# Patient Record
Sex: Male | Born: 1960 | Hispanic: Yes | Marital: Single | Smoking: Never smoker
Health system: Southern US, Community
[De-identification: ages and names within clinical notes are randomized; demographics above are authoritative.]

---

## 1999-12-04 ENCOUNTER — Emergency Department (HOSPITAL_COMMUNITY): Admission: EM | Admit: 1999-12-04 | Discharge: 1999-12-04 | Payer: Self-pay | Admitting: *Deleted

## 1999-12-23 ENCOUNTER — Encounter: Payer: Self-pay | Admitting: Emergency Medicine

## 1999-12-23 ENCOUNTER — Emergency Department (HOSPITAL_COMMUNITY): Admission: EM | Admit: 1999-12-23 | Discharge: 1999-12-23 | Payer: Self-pay | Admitting: Emergency Medicine

## 2000-01-01 ENCOUNTER — Emergency Department (HOSPITAL_COMMUNITY): Admission: EM | Admit: 2000-01-01 | Discharge: 2000-01-01 | Payer: Self-pay | Admitting: Emergency Medicine

## 2000-03-17 ENCOUNTER — Emergency Department (HOSPITAL_COMMUNITY): Admission: EM | Admit: 2000-03-17 | Discharge: 2000-03-17 | Payer: Self-pay | Admitting: Emergency Medicine

## 2000-03-17 ENCOUNTER — Encounter: Payer: Self-pay | Admitting: Emergency Medicine

## 2004-08-13 ENCOUNTER — Ambulatory Visit: Payer: Self-pay | Admitting: Internal Medicine

## 2004-08-13 ENCOUNTER — Inpatient Hospital Stay (HOSPITAL_COMMUNITY): Admission: EM | Admit: 2004-08-13 | Discharge: 2004-08-15 | Payer: Self-pay

## 2004-08-22 ENCOUNTER — Ambulatory Visit: Payer: Self-pay | Admitting: Internal Medicine

## 2012-05-12 ENCOUNTER — Ambulatory Visit: Payer: BC Managed Care – PPO

## 2012-05-12 ENCOUNTER — Ambulatory Visit (INDEPENDENT_AMBULATORY_CARE_PROVIDER_SITE_OTHER): Payer: BC Managed Care – PPO | Admitting: Family Medicine

## 2012-05-12 VITALS — BP 128/84 | HR 63 | Temp 97.9°F | Resp 14 | Ht 65.5 in | Wt 159.4 lb

## 2012-05-12 DIAGNOSIS — M549 Dorsalgia, unspecified: Secondary | ICD-10-CM

## 2012-05-12 DIAGNOSIS — S32009A Unspecified fracture of unspecified lumbar vertebra, initial encounter for closed fracture: Secondary | ICD-10-CM

## 2012-05-12 DIAGNOSIS — S32010A Wedge compression fracture of first lumbar vertebra, initial encounter for closed fracture: Secondary | ICD-10-CM

## 2012-05-12 MED ORDER — HYDROCODONE-ACETAMINOPHEN 5-500 MG PO TABS: 1.0000 | ORAL_TABLET | Freq: Three times a day (TID) | ORAL | Status: DC | PRN

## 2012-05-12 NOTE — Progress Notes (Signed)
Is a 51 year old gentleman who comes in with his wife and son. He's complaining about lower back pain following a fall on Saturday. 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 He fell out of a tree on Saturday landing on his butt. He has no weakness or numbness in his legs but is moving very gingerly and carefully and cannot bend easily.  Objective: Mild discomfort when moving but no acute distress No bony tenderness in the back and no ecchymosis. There is no overlying skin rash.  Straight-leg raising: Negative Reflexes: Normal AJ and KJ Muscle strength and gait are stable.  [facility] reading (PRIMARY) by  Dr. Milus Glazier:  L1 compression fracture  Assessment:  L1 compression fracture without neurological compromise. Patient is in moderate pain and his job involves quite a bit of lifting.  Plan: Take patient out of work for week, provide Vicodin 5-500 #30 every 6 hours when necessary and recheck in 1 week for allowing patient to return to work. 1. Back pain  DG Lumbar Spine 2-3 Views, HYDROcodone-acetaminophen (VICODIN) 5-500 MG per tablet

## 2012-05-19 ENCOUNTER — Ambulatory Visit (INDEPENDENT_AMBULATORY_CARE_PROVIDER_SITE_OTHER): Payer: BC Managed Care – PPO | Admitting: Family Medicine

## 2012-05-19 VITALS — BP 104/67 | HR 57 | Temp 98.5°F | Resp 16 | Ht 64.5 in | Wt 162.0 lb

## 2012-05-19 DIAGNOSIS — M549 Dorsalgia, unspecified: Secondary | ICD-10-CM

## 2012-05-19 DIAGNOSIS — M545 Low back pain, unspecified: Secondary | ICD-10-CM

## 2012-05-19 MED ORDER — HYDROCODONE-ACETAMINOPHEN 5-500 MG PO TABS: 1.0000 | ORAL_TABLET | Freq: Three times a day (TID) | ORAL | Status: DC | PRN

## 2012-05-19 NOTE — Progress Notes (Signed)
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  He has been out of work for 5 days and the pain level is down to 4/10.  His job involves heavy lifting all day.  O:  Less distress.  Patient now has full back ROM with no tenderness.  Muscle strength in legs appears to be good No loss of feeling in legs  A:  Improving, but cannot return to work this week to allow more complete healing  P:  Return Sunday with hopes of returning to work, at least restricted duty 1. Back pain  HYDROcodone-acetaminophen (VICODIN) 5-500 MG per tablet

## 2012-05-25 ENCOUNTER — Ambulatory Visit (INDEPENDENT_AMBULATORY_CARE_PROVIDER_SITE_OTHER): Payer: BC Managed Care – PPO | Admitting: Emergency Medicine

## 2012-05-25 VITALS — BP 102/72 | HR 64 | Temp 98.4°F | Resp 16 | Ht 66.0 in | Wt 167.0 lb

## 2012-05-25 DIAGNOSIS — M549 Dorsalgia, unspecified: Secondary | ICD-10-CM

## 2012-05-25 DIAGNOSIS — IMO0002 Reserved for concepts with insufficient information to code with codable children: Secondary | ICD-10-CM

## 2012-05-25 MED ORDER — HYDROCODONE-ACETAMINOPHEN 5-500 MG PO TABS: 1.0000 | ORAL_TABLET | Freq: Three times a day (TID) | ORAL | Status: AC | PRN
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
## 2012-06-01 ENCOUNTER — Other Ambulatory Visit: Payer: Self-pay | Admitting: Family Medicine

## 2013-03-06 ENCOUNTER — Encounter (HOSPITAL_COMMUNITY): Payer: Self-pay | Admitting: *Deleted

## 2013-03-06 ENCOUNTER — Emergency Department (HOSPITAL_COMMUNITY)
Admission: EM | Admit: 2013-03-06 | Discharge: 2013-03-06 | Disposition: A | Discharge status: Home / Self Care | Payer: Worker's Compensation | Attending: Emergency Medicine | Admitting: Emergency Medicine

## 2013-03-06 ENCOUNTER — Emergency Department (HOSPITAL_COMMUNITY): Payer: Worker's Compensation

## 2013-03-06 DIAGNOSIS — S298XXA Other specified injuries of thorax, initial encounter: Secondary | ICD-10-CM | POA: Insufficient documentation

## 2013-03-06 DIAGNOSIS — Y990 Civilian activity done for income or pay: Secondary | ICD-10-CM | POA: Insufficient documentation

## 2013-03-06 DIAGNOSIS — IMO0002 Reserved for concepts with insufficient information to code with codable children: Secondary | ICD-10-CM | POA: Insufficient documentation

## 2013-03-06 DIAGNOSIS — R42 Dizziness and giddiness: Secondary | ICD-10-CM | POA: Insufficient documentation

## 2013-03-06 DIAGNOSIS — S2341XA Sprain of ribs, initial encounter: Secondary | ICD-10-CM

## 2013-03-06 DIAGNOSIS — S199XXA Unspecified injury of neck, initial encounter: Secondary | ICD-10-CM | POA: Insufficient documentation

## 2013-03-06 DIAGNOSIS — R55 Syncope and collapse: Secondary | ICD-10-CM | POA: Insufficient documentation

## 2013-03-06 DIAGNOSIS — S0993XA Unspecified injury of face, initial encounter: Secondary | ICD-10-CM | POA: Insufficient documentation

## 2013-03-06 DIAGNOSIS — W2209XA Striking against other stationary object, initial encounter: Secondary | ICD-10-CM | POA: Insufficient documentation

## 2013-03-06 DIAGNOSIS — Y9289 Other specified places as the place of occurrence of the external cause: Secondary | ICD-10-CM | POA: Insufficient documentation

## 2013-03-06 DIAGNOSIS — S0990XA Unspecified injury of head, initial encounter: Secondary | ICD-10-CM | POA: Insufficient documentation

## 2013-03-06 MED ORDER — HYDROCODONE-ACETAMINOPHEN 5-325 MG PO TABS
2.0000 | ORAL_TABLET | Freq: Once | ORAL | Status: AC
  Administered 2013-03-06: 2 via ORAL
  Filled 2013-03-06: qty 2

## 2013-03-06 MED ORDER — HYDROCODONE-ACETAMINOPHEN 5-325 MG PO TABS: 2.0000 | ORAL_TABLET | ORAL | Status: DC | PRN

## 2013-03-06 MED ORDER — ONDANSETRON 4 MG PO TBDP: 4.0000 mg | ORAL_TABLET | Freq: Three times a day (TID) | ORAL | Status: DC | PRN

## 2013-03-06 NOTE — ED Notes (Signed)
Workers comp urine drug screen completed

## 2013-03-06 NOTE — ED Notes (Signed)
Pt and supervisor requesting workers comp urine drug screen

## 2013-03-06 NOTE — Progress Notes (Signed)
[facility] has seen patient and provided him with a list of primary care resources, as well as, a [facility] application. Patient family stated that he already had a PCP. I still left them with the information just in case they needed another source.
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
## 2013-03-17 NOTE — ED Provider Notes (Signed)
Medical screening examination/treatment/procedure(s) were performed by non-physician practitioner and as supervising physician I was immediately available for consultation/collaboration.    Vida Roller, MD 03/17/13 1340

## 2014-03-08 ENCOUNTER — Ambulatory Visit (INDEPENDENT_AMBULATORY_CARE_PROVIDER_SITE_OTHER): Payer: BC Managed Care – PPO | Admitting: Family Medicine

## 2014-03-08 VITALS — BP 130/70 | HR 69 | Temp 97.4°F | Resp 16 | Ht 66.0 in | Wt 162.0 lb

## 2014-03-08 DIAGNOSIS — L237 Allergic contact dermatitis due to plants, except food: Secondary | ICD-10-CM

## 2014-03-08 DIAGNOSIS — R21 Rash and other nonspecific skin eruption: Secondary | ICD-10-CM

## 2014-03-08 LAB — POCT CBC
Granulocyte percent: 72.9 %G (ref 37–80)
HCT, POC: 45.7 % (ref 43.5–53.7)
Hemoglobin: 14.7 g/dL (ref 14.1–18.1)
Lymph, poc: 1.8 (ref 0.6–3.4)
MCH, POC: 31.5 pg — AB (ref 27–31.2)
MCHC: 32.2 g/dL (ref 31.8–35.4)
MCV: 98.1 fL — AB (ref 80–97)
MID (cbc): 0.7 (ref 0–0.9)
MPV: 8.3 fL (ref 0–99.8)
POC Granulocyte: 6.6 (ref 2–6.9)
POC LYMPH PERCENT: 19.5 %L (ref 10–50)
POC MID %: 7.6 %M (ref 0–12)
Platelet Count, POC: 244 10*3/uL (ref 142–424)
RBC: 4.66 M/uL — AB (ref 4.69–6.13)
RDW, POC: 13.3 %
WBC: 9 10*3/uL (ref 4.6–10.2)

## 2014-03-08 LAB — POCT SEDIMENTATION RATE: POCT SED RATE: 6 mm/hr (ref 0–22)

## 2014-03-08 MED ORDER — METHYLPREDNISOLONE ACETATE 80 MG/ML IJ SUSP
120.0000 mg | Freq: Once | INTRAMUSCULAR | Status: AC
  Administered 2014-03-08: 120 mg via INTRAMUSCULAR

## 2014-03-08 MED ORDER — PREDNISONE 10 MG PO TABS: ORAL_TABLET | ORAL | Status: DC

## 2014-03-08 MED ORDER — CEPHALEXIN 500 MG PO CAPS: 500.0000 mg | ORAL_CAPSULE | Freq: Four times a day (QID) | ORAL | Status: DC

## 2014-03-08 NOTE — Patient Instructions (Signed)
Hiedra venenosa  (Poison Ivy) Luego de la exposicin previa a la planta. La erupcin suele aparecer 48 horas despus de la exposicin. Suelen ser bultos (ppulas) o ampollas (vesculas) en un patrn lineal. abrirse. Los ojos tambin podran hincharse. Las hinchazn es peor por la maana y mejora a medida que avanza el da. Deben tomarse todas las precauciones para prevenir una infeccin bacteriana (por grmenes) secundaria, que puede ocasionar cicatrices. Mantenga todas las reas abiertas secas, limpias y vendadas y cbralas con un ungento antibacteriano, en caso que lo necesite. Si no aparece una infeccin secundaria, esta dermatitis generalmente se cura dentro de las 2 o 3 semanas sin tratamiento. INSTRUCCIONES PARA EL CUIDADO DOMICILIARIO Lvese cuidadosamente con agua y jabn tan pronto como ocurra la exposicin al txico. Tiene alrededor de media hora para retirar la resina de la planta antes de que le cause el sarpullido. El lavado destruir rpidamente el aceite o antgeno que se encuentra sobre la piel y que podr causar el sarpullido. Lave enrgicamente debajo de las uas. Todo resto de resina seguir diseminando el sarpullido. No se frote la piel vigorosamente cuando lava la zona afectada. La dermatitis no se extender si retira todo el aceite de la planta que haya quedado en su cuerpo. Un sarpullido que se ha transformado en lesiones que supuran (llagas) no diseminar el sarpullido, a menos que no se haya lavado cuidadosamente. Tambin es importante lavar todas las prendas que haya utilizado. Pueden tener alrgenos activos. El sarpullido volver, an varios das ms tarde. La mejor medida es evitar el contacto con la planta en el futuro. La hiedra venenosa puede reconocerse por el nmero de hojas, En general, la hiedra venenosa tiene tres hojas con ramas floridas en un tallo simple. Podr adquirir difenhidramina que es un medicamento de venta libre, y utilizarlo segn lo necesite para aliviar la  picazn. No conduzca automviles si este medicamento le produce somnolencia. Consulte con el profesional que lo asiste acerca de los medicamentos que podr administrarle a los nios. SOLICITE ATENCIN MDICA SI:  Observa reas abiertas.  Enrojecimiento que se extiende ms all de la zona del sarpullido.  Una secrecin purulenta (similar al pus).  Aumento del dolor.  Desarrolla otros signos de infeccin (como fiebre). Document Released: 07/04/2005 Document Revised: 12/17/2011 [employer] Patient Information 2014 [employer].  

## 2014-03-08 NOTE — Progress Notes (Signed)
Subjective:    Patient ID: Christopher Lam, male    DOB: 05-29-1961, 53 y.o.   MRN: [id number] This chart was scribed for Delman Cheadle, MD by Randa Evens, ED Scribe. This Patient was seen in room 01 and the patients care was started at 6:55 PM  Chief Complaint  Patient presents with  . Poison Ivy    rash on bilateral arms, legs and face x 4 days    HPI HPI Comments: Christopher Lam is a 53 y.o. male who presents to the [facility] complaining of poison ivy rash onset Thursday night. States that the rash is located on all of his extremities. He states that he was cutting weeds when he was exposed to the poison ivy. States that he has been trying otc insect bite and hydrocortisone lotion with no improvement to his symptoms.He denies cough, SOB, chest pain or fever.  No past medical history on file. Current Outpatient Prescriptions on File Prior to Visit  Medication Sig Dispense Refill  . HYDROcodone-acetaminophen (NORCO/VICODIN) 5-325 MG per tablet Take 2 tablets by mouth every 4 (four) hours as needed for pain.  6 tablet  0  . Multiple Vitamin (MULTIVITAMIN WITH MINERALS) TABS Take 1 tablet by mouth daily.      . ondansetron (ZOFRAN ODT) 4 MG disintegrating tablet Take 1 tablet (4 mg total) by mouth every 8 (eight) hours as needed for nausea.  10 tablet  0   No current facility-administered medications on file prior to visit.   No Known Allergies  Review of Systems  Constitutional: Negative for fever.  Respiratory: Negative for cough and shortness of breath.   Cardiovascular: Negative for chest pain.  Skin: Positive for rash.   BP 130/70  Pulse 69  Temp(Src) 97.4 F (36.3 C)  Resp 16  Ht 5\' 6"  (1.676 m)  Wt 162 lb (73.483 kg)  BMI 26.16 kg/m2  SpO2 98%  Objective:    Physical Exam  Nursing note and vitals reviewed. Constitutional: He is oriented to person, place, and time. He appears well-developed and well-nourished. No distress.  HENT:  Head:  Normocephalic and atraumatic.  Eyes: EOM are normal.  Neck: Neck supple.  Cardiovascular: Normal rate.   Pulmonary/Chest: Effort normal. No respiratory distress.  Musculoskeletal: Normal range of motion.  Neurological: He is alert and oriented to person, place, and time.  Skin: Skin is warm and dry. Rash noted. There is erythema.  Bilateral forearms with tense diffuse erythema, tiny vesicle almost spread con fluently extending into smaller papules up bilateral arms, forearms with blanching erythema warmth and not pitting edema  Erythema around left upper and lower eye lid, left forehead , cheeks,   Left leg with diffuse erythema edema, and vesicles  spilling honey colored fluid. No lesions on right leg  Psychiatric: He has a normal mood and affect. His behavior is normal.   Results for orders placed in visit on 03/08/14  POCT CBC      Result Value Ref Range   WBC 9.0  4.6 - 10.2 K/uL   Lymph, poc 1.8  0.6 - 3.4   POC LYMPH PERCENT 19.5  10 - 50 %L   MID (cbc) 0.7  0 - 0.9   POC MID % 7.6  0 - 12 %M   POC Granulocyte 6.6  2 - 6.9   Granulocyte percent 72.9  37 - 80 %G   RBC 4.66 (*) 4.69 - 6.13 M/uL   Hemoglobin 14.7  14.1 -  18.1 g/dL   HCT, POC 45.7  43.5 - 53.7 %   MCV 98.1 (*) 80 - 97 fL   MCH, POC 31.5 (*) 27 - 31.2 pg   MCHC 32.2  31.8 - 35.4 g/dL   RDW, POC 13.3     Platelet Count, POC 244  142 - 424 K/uL   MPV 8.3  0 - 99.8 fL    Assessment & Plan:   Rash and nonspecific skin eruption - Plan: POCT CBC, POCT SEDIMENTATION RATE, methylPREDNISolone acetate (DEPO-MEDROL) injection 120 mg  Poison ivy dermatitis  Meds ordered this encounter  Medications  . methylPREDNISolone acetate (DEPO-MEDROL) injection 120 mg    Sig:   . cephALEXin (KEFLEX) 500 MG capsule    Sig: Take 1 capsule (500 mg total) by mouth 4 (four) times daily.    Dispense:  28 capsule    Refill:  0    Instructions in spanish please  . predniSONE (DELTASONE) 10 MG tablet    Sig: 6-5-4-3-2-1 tabs po  qd x 6d taper.    Dispense:  21 tablet    Refill:  0    Instructions in spanish please    I personally performed the services described in this documentation, which was scribed in my presence. The recorded information has been reviewed and considered, and addended by me as needed.  Delman Cheadle, MD MPH

## 2015-04-02 IMAGING — CR DG RIBS W/ CHEST 3+V*R*
3 series · 3 of 3 positions shown · non-contrast
Comparison: 08/14/2004

CLINICAL DATA: Trauma, rib pain

RIGHT RIBS AND CHEST - 3+ VIEW

[w chest pa (1 of 3)]
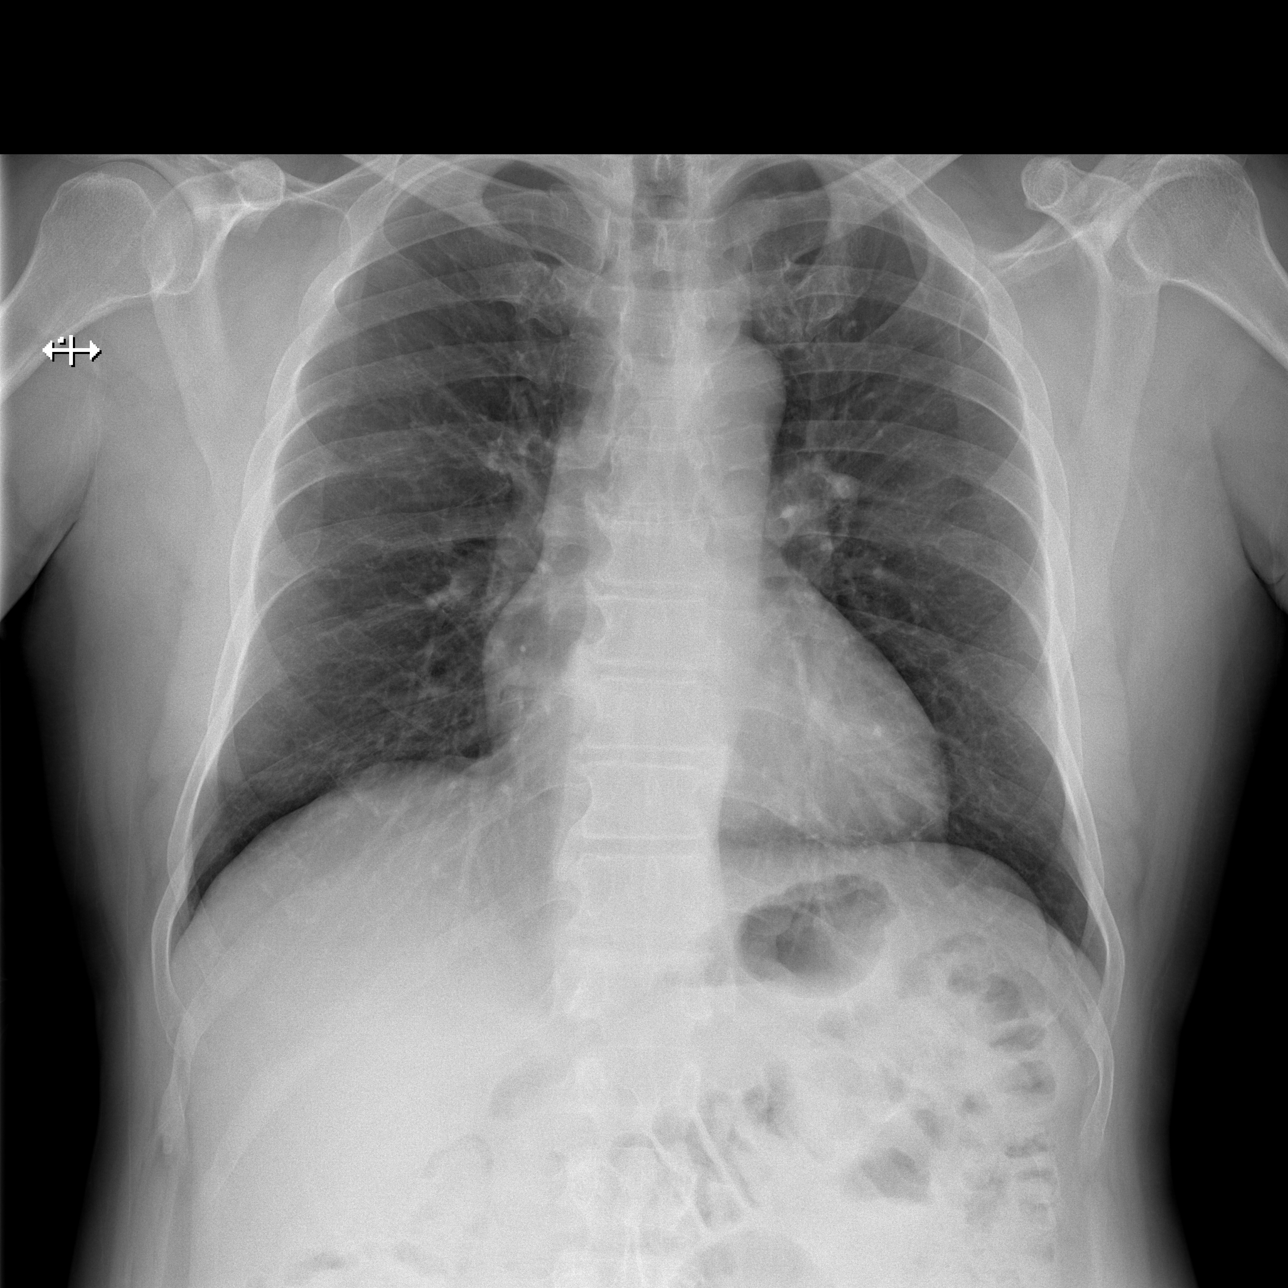

[w chest pa (2 of 3)]
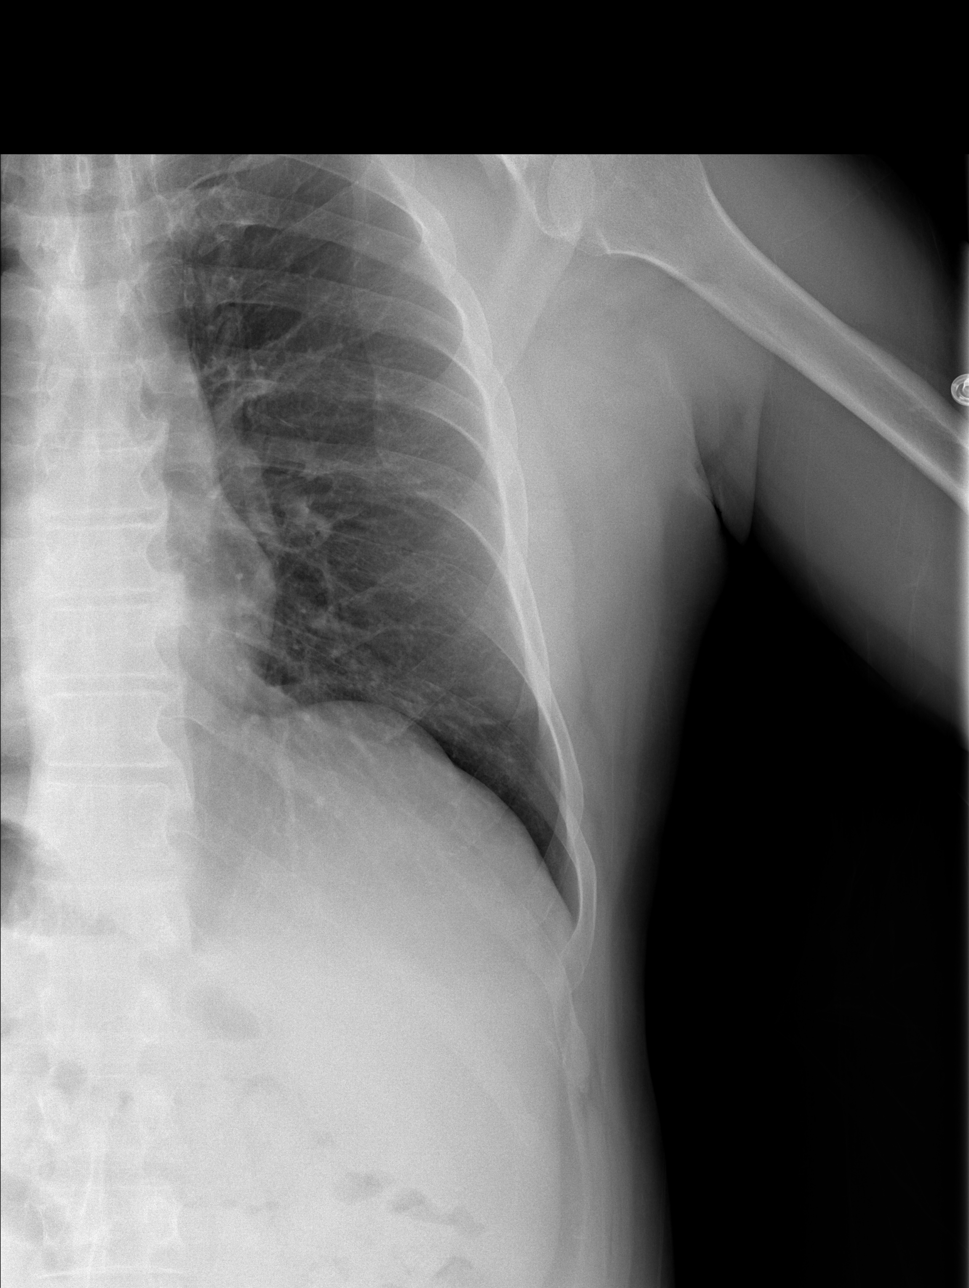

[w chest pa (3 of 3)]
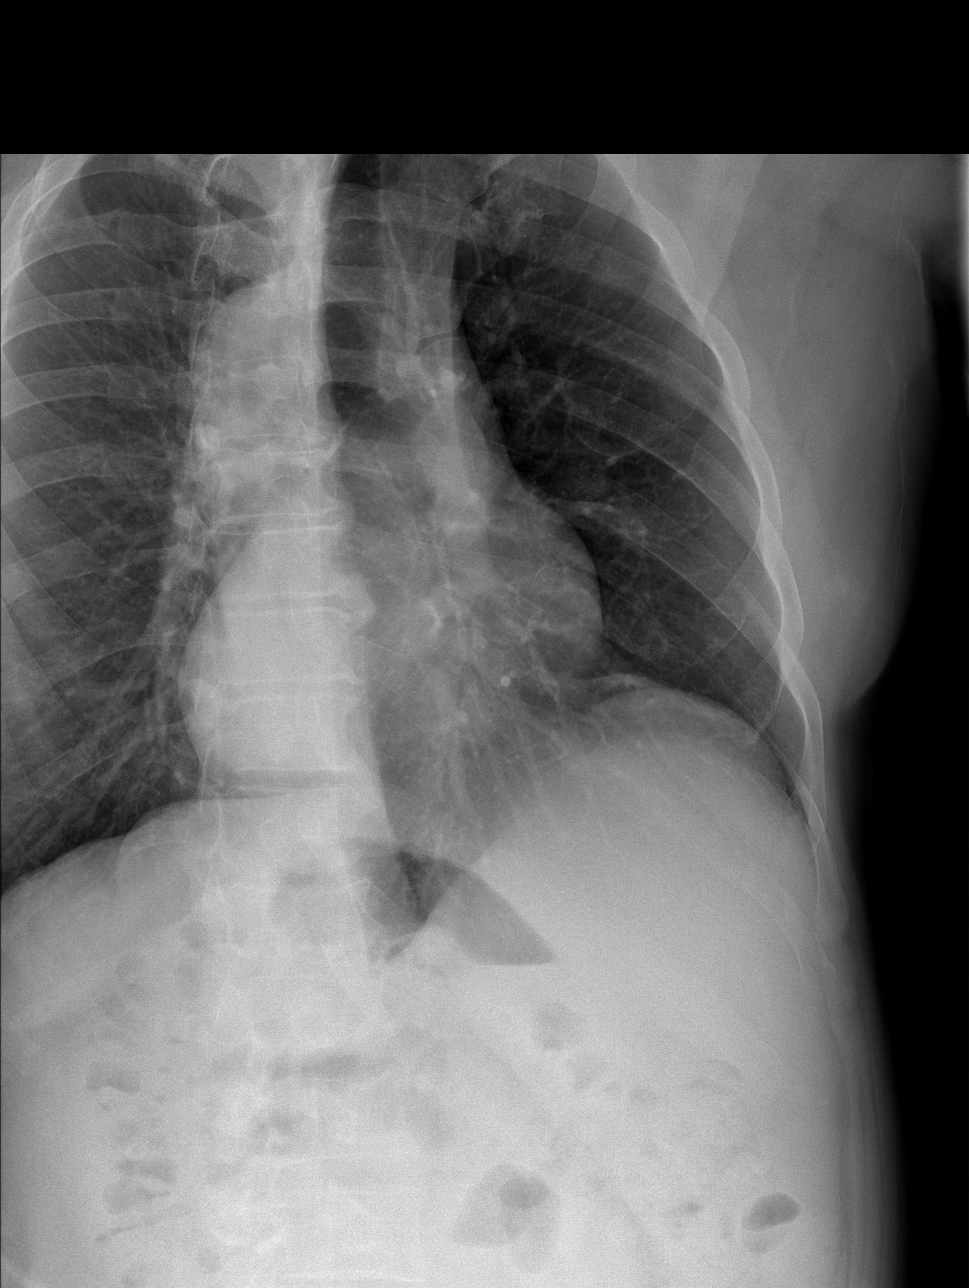

[3 of 3 positions shown; findings below may reference images not displayed]

FINDINGS: Normal heart size and vascularity.  Clear lungs.  No
collapse, consolidation, airspace process, effusion, or
pneumothorax.

Right rib views demonstrate no displaced fracture or focal rib
abnormality.  No subcutaneous emphysema.  No soft tissue
abnormality.
IMPRESSION: No acute finding

## 2015-04-02 IMAGING — CT CT HEAD W/O CM
3 of 6 series · 16 of 47 positions shown, 19 images · non-contrast
Comparison: 08/13/2004
COMPARISON: 08/13/2004

CLINICAL DATA: Head injury, headache, dizziness, neck pain

CT HEAD WITHOUT CONTRAST
TECHNIQUE: Contiguous axial images were obtained from the base of
the skull through the vertex without contrast
TECHNIQUE: Multidetector CT imaging of the cervical spine was
performed without intravenous contrast.  Multiplanar CT image
reconstructions were also generated.

[Series 602: <mpr thick range> · axial · 0.34mm/px · z∈[-283,-125]mm · 10 of 105 slices shown, 13 images]
[im 10/105  brain]
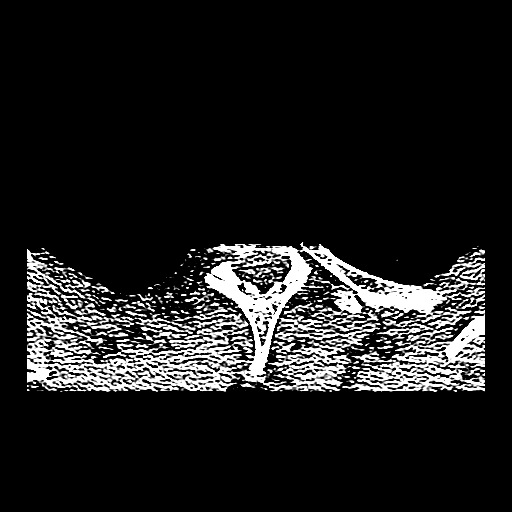
[im 10/105  bone]
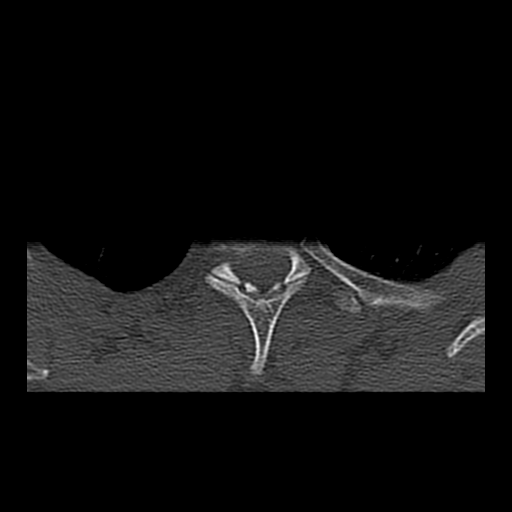
[im 19/105  brain]
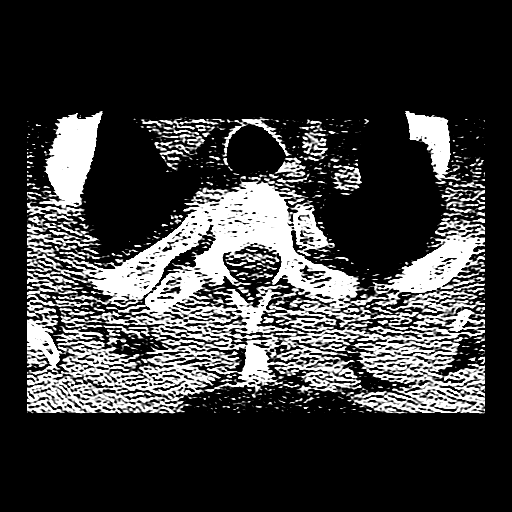
[im 29/105  brain]
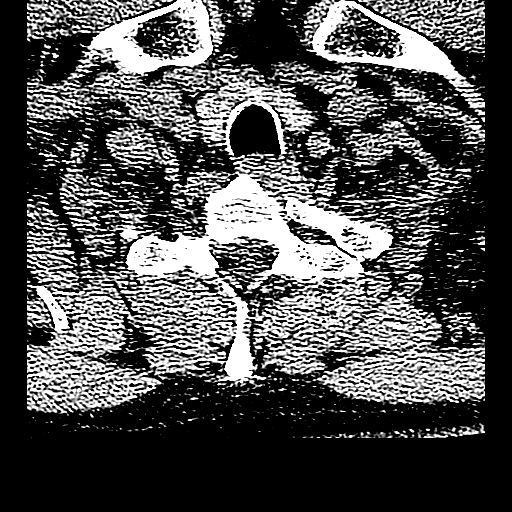
[im 38/105  brain]
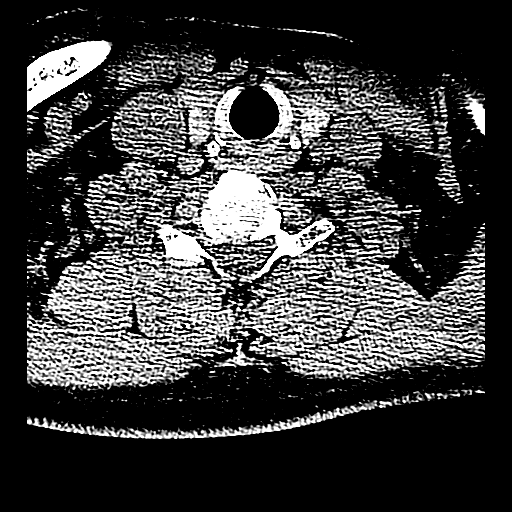
[im 48/105  brain]
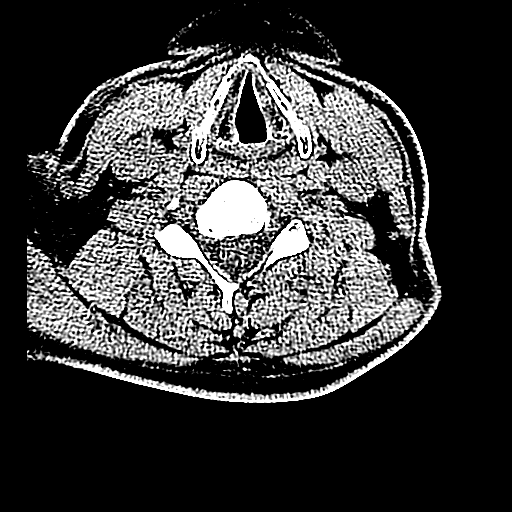
[im 48/105  bone]
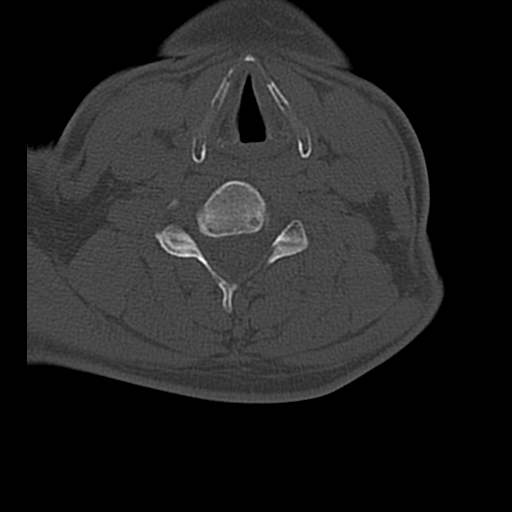
[im 57/105  brain]
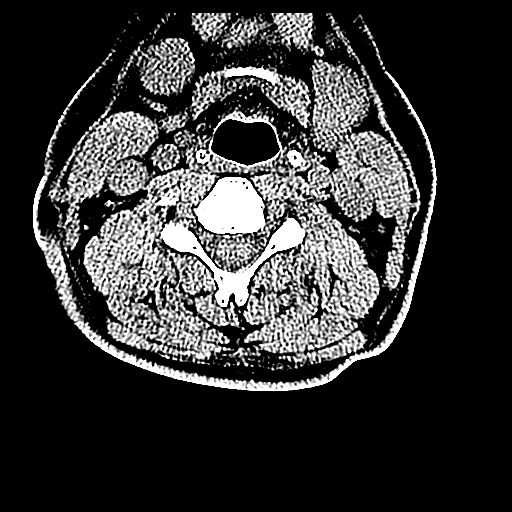
[im 67/105  brain]
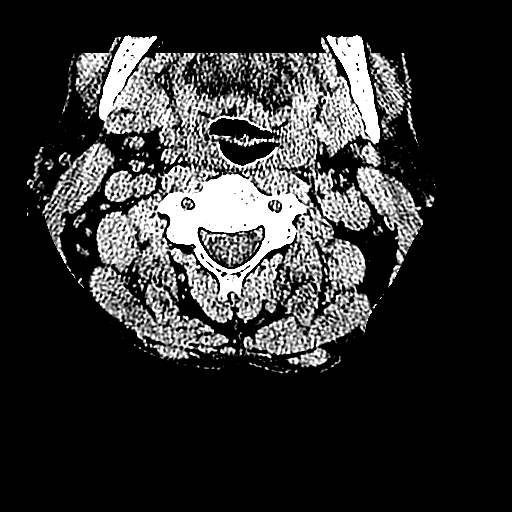
[im 76/105  brain]
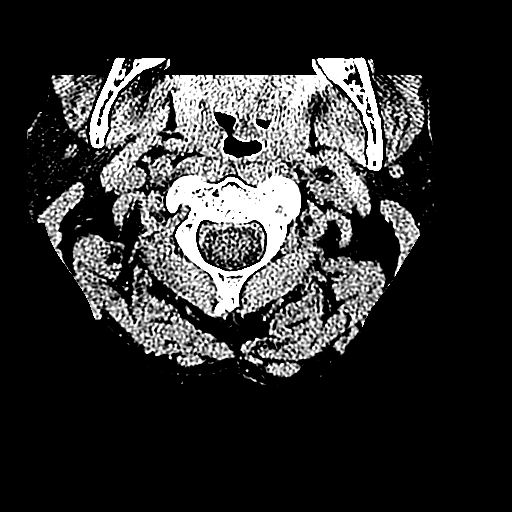
[im 86/105  brain]
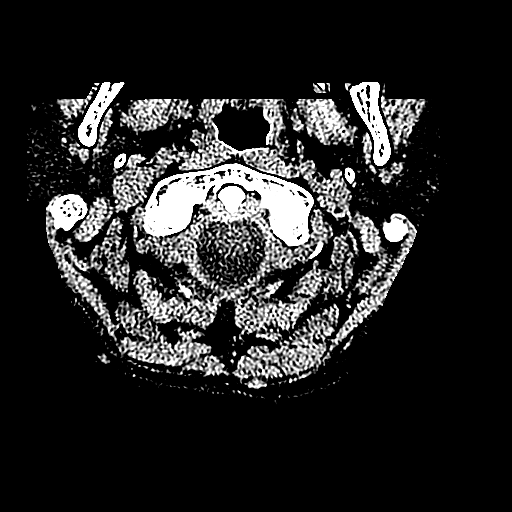
[im 86/105  bone]
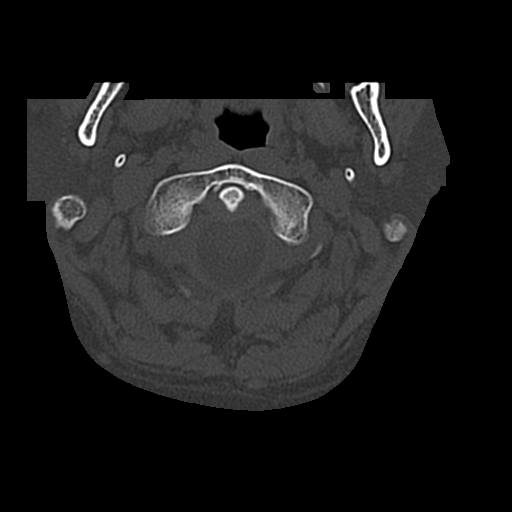
[im 95/105  brain]
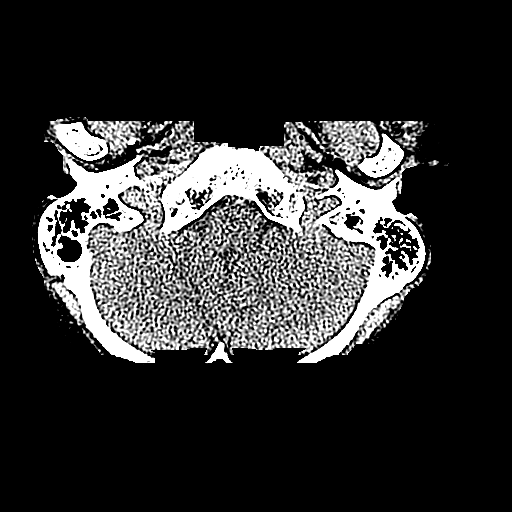

[Series 603: <mpr thick range(1)> · coronal · 0.34mm/px · 3 of 54 slices shown]
[im 18/54  brain]
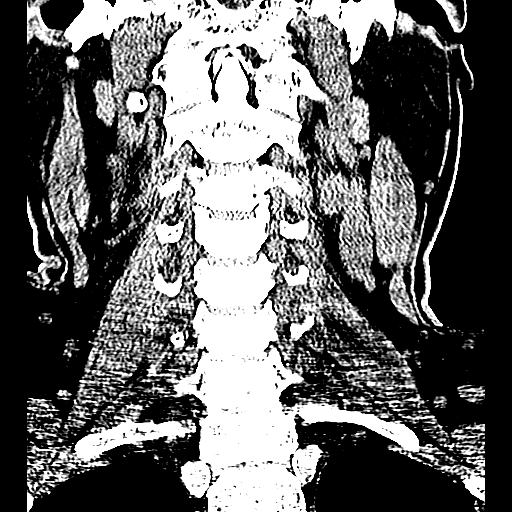
[im 24/54  brain]
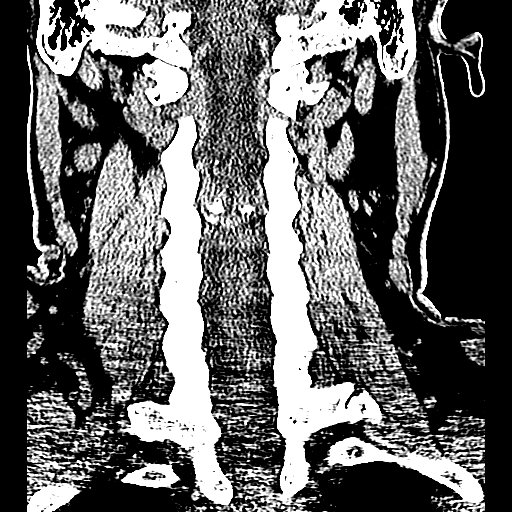
[im 30/54  brain]
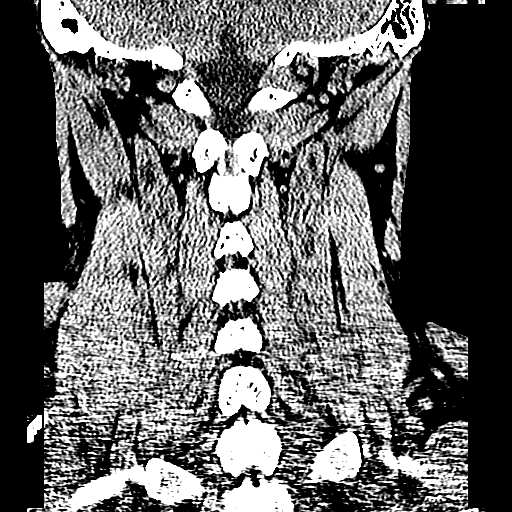

[Series 604: <mpr thick range(2)> · sagittal · 0.34mm/px · 3 of 49 slices shown]
[im 17/49  brain]
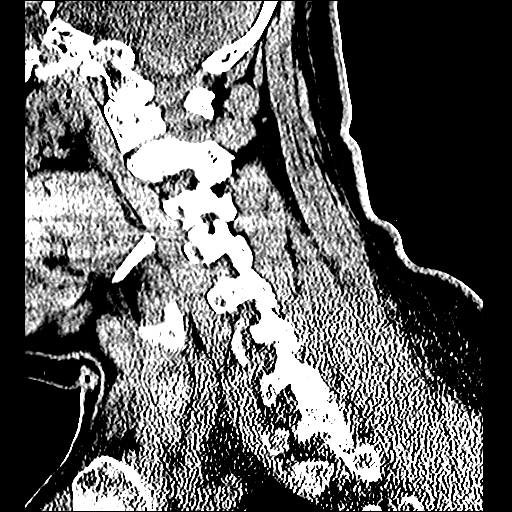
[im 25/49  brain]
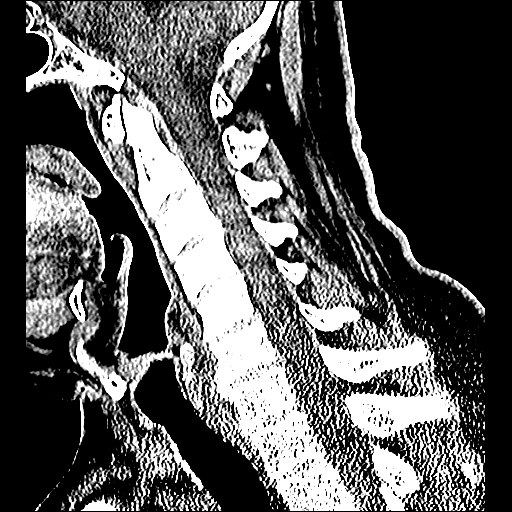
[im 33/49  brain]
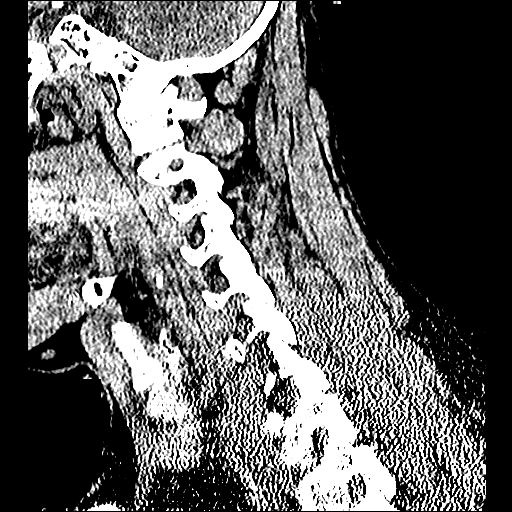

[16 of 47 positions shown; findings below may reference images not displayed]

FINDINGS: The brain has a normal appearance without evidence for
hemorrhage, acute infarction, hydrocephalus, or mass lesion.  There
is no extra axial fluid collection.  The skull and paranasal
sinuses are normal.
IMPRESSION: Normal CT of the head without contrast.

CT CERVICAL SPINE WITHOUT CONTRAST
FINDINGS: Straightened cervical spine alignment may be positional.
No fracture, compression deformity or focal kyphosis.  Facets
aligned.  Similar minor degenerative change at the C6-7 anteriorly.
Preserved vertebral body heights.  Intact odontoid.  Normal
prevertebral soft tissues.  Lung apices clear.
IMPRESSION: Stable minor degenerative changes.  No acute fracture or osseous
abnormality by CT.

## 2016-08-07 DIAGNOSIS — Z23 Encounter for immunization: Secondary | ICD-10-CM | POA: Diagnosis not present

## 2016-11-26 ENCOUNTER — Ambulatory Visit (INDEPENDENT_AMBULATORY_CARE_PROVIDER_SITE_OTHER): Payer: BLUE CROSS/BLUE SHIELD | Admitting: Urgent Care

## 2016-11-26 VITALS — BP 122/68 | HR 60 | Temp 98.1°F | Resp 16 | Ht 66.0 in | Wt 170.4 lb

## 2016-11-26 DIAGNOSIS — Z1211 Encounter for screening for malignant neoplasm of colon: Secondary | ICD-10-CM | POA: Diagnosis not present

## 2016-11-26 DIAGNOSIS — Z0000 Encounter for general adult medical examination without abnormal findings: Secondary | ICD-10-CM

## 2016-11-26 DIAGNOSIS — F1010 Alcohol abuse, uncomplicated: Secondary | ICD-10-CM | POA: Diagnosis not present

## 2016-11-26 DIAGNOSIS — Z23 Encounter for immunization: Secondary | ICD-10-CM | POA: Diagnosis not present

## 2016-11-26 DIAGNOSIS — H538 Other visual disturbances: Secondary | ICD-10-CM | POA: Diagnosis not present

## 2016-11-26 NOTE — Patient Instructions (Addendum)
Keeping you healthy  Get these tests  Blood pressure- Have your blood pressure checked once a year by your healthcare provider.  Normal blood pressure is 120/80  Weight- Have your body mass index (BMI) calculated to screen for obesity.  BMI is a measure of body fat based on height and weight. You can also calculate your own BMI at ViewBanking.si.  Cholesterol- Have your cholesterol checked every year.  Diabetes- Have your blood sugar checked regularly if you have high blood pressure, high cholesterol, have a family history of diabetes or if you are overweight.  Screening for Colon Cancer- Colonoscopy starting at age 42.  Screening may begin sooner depending on your family history and other health conditions. Follow up colonoscopy as directed by your Gastroenterologist.  Screening for Prostate Cancer- Both blood work (PSA) and a rectal exam help screen for Prostate Cancer.  Screening begins at age 2 with African-American men and at age 68 with Caucasian men.  Screening may begin sooner depending on your family history.  Take these medicines  Aspirin- One aspirin daily can help prevent Heart disease and Stroke.  Flu shot- Every fall.  Tetanus- Every 10 years.  Zostavax- Once after the age of 66 to prevent Shingles.  Pneumonia shot- Once after the age of 57; if you are younger than 14, ask your healthcare provider if you need a Pneumonia shot.  Take these steps  Don't smoke- If you do smoke, talk to your doctor about quitting.  For tips on how to quit, go to www.smokefree.gov or call 1-800-QUIT-NOW.  Be physically active- Exercise 5 days a week for at least 30 minutes.  If you are not already physically active start slow and gradually work up to 30 minutes of moderate physical activity.  Examples of moderate activity include walking briskly, mowing the yard, dancing, swimming, bicycling, etc.  Eat a healthy diet- Eat a variety of healthy food such as fruits, vegetables, low  fat milk, low fat cheese, yogurt, lean meant, poultry, fish, beans, tofu, etc. For more information go to www.thenutritionsource.org  Drink alcohol in moderation- Limit alcohol intake to less than two drinks a day. Never drink and drive.  Dentist- Brush and floss twice daily; visit your dentist twice a year.  Depression- Your emotional health is as important as your physical health. If you're feeling down, or losing interest in things you would normally enjoy please talk to your healthcare provider.  Eye exam- Visit your eye doctor every year.  Safe sex- If you may be exposed to a sexually transmitted infection, use a condom.  Seat belts- Seat belts can save your life; always wear one.  Smoke/Carbon Monoxide detectors- These detectors need to be installed on the appropriate level of your home.  Replace batteries at least once a year.  Skin cancer- When out in the sun, cover up and use sunscreen 15 SPF or higher.  Violence- If anyone is threatening you, please tell your healthcare provider.  Living Will/ Health care power of attorney- Speak with your healthcare provider and family.    Vacuna Td (contra la difteria y el ttanos): Lo que debe saber (Td Vaccine Margaretmary Eddy and Diphtheria]: What You Need to Know) 1. Por qu vacunarse? El ttanos y la difteria son enfermedades muy graves. Son Futures trader frecuentes en los [place] actualmente, pero las personas que se infectan suelen tener complicaciones graves. La vacuna Td se [place] para proteger a los adolescentes y a los adultos de ambas enfermedades. Tanto el ttanos como la difteria  son infecciones causadas por bacterias. La difteria se transmite de persona a persona a travs de la tos o el estornudo. La bacteria que causa el ttanos entra al cuerpo a travs de cortes, raspones o heridas. El TTANOS (trismo) provoca entumecimiento y Writer dolorosa de los msculos, por lo general, en todo el cuerpo.  Puede causar el endurecimiento de  los msculos de la cabeza y el cuello, de modo que impide abrir la boca, tragar y en algunos casos, Ambulance person. El ttanos es causa de muerte en aproximadamente 1de cada 10personas que contraen la infeccin, incluso despus de que reciben la mejor atencin mdica. La DIFTERIA puede hacer que se forme una membrana gruesa en la parte posterior de la garganta.  Puede causar problemas respiratorios, parlisis, insuficiencia cardaca e incluso la muerte. Antes de las vacunas, en los [place] se informaban 200000 casos de difteria y cientos de casos de ttanos cada ao. Desde que comenz la vacunacin, los informes de casos de ambas enfermedades se han reducido en un 99%. 2. Edward Jolly Td La vacuna Td protege a adolescentes y adultos contra el ttanos y la difteria. La vacuna Td habitualmente se aplica como dosis de refuerzo cada 10aos, pero tambin puede administrarse antes si la persona sufre una George o herida sucia y grave. A veces, en lugar de la vacuna Td, se recomienda una vacuna llamada Tdap, que protege contra la tosferina, adems de proteger contra el ttanos y la difteria. El mdico o la persona que le aplique la vacuna puede darle ms informacin al [employer]. La Td puede administrarse de manera segura simultneamente con otras vacunas. 3. Algunas personas no deben recibir la vacuna  Una persona que alguna vez ha tenido una reaccin alrgica potencialmente mortal a una dosis anterior de cualquier vacuna contra el ttanos o la difteria, O que tenga una alergia grave a cualquier parte de esta vacuna, no debe recibir la vacuna Td. Informe a la persona que le aplica la vacuna si usted tiene cualquier alergia grave.  Consulte con su mdico si:  tuvo hinchazn o dolor intenso despus de recibir cualquier vacuna contra la difteria o el ttanos,  alguna vez ha sufrido el sndrome de [place],  no se siente Pharmacologist en que se ha programado la vacuna. 4. [place] de [place] reaccin a  la vacuna Con cualquier medicamento, incluyendo las vacunas, existe la posibilidad de que aparezcan efectos secundarios. Suelen ser leves y desaparecen por s solos. Tambin son posibles las reacciones graves, pero en raras ocasiones. [place] personas a las que se les aplica la vacuna Td no tienen ningn problema. Problemas leves despus de la vacuna Td: (No interfirieron en otras actividades)  Dolor en el lugar donde se aplic la vacuna (alrededor de 8de cada 10personas)  Enrojecimiento o hinchazn en el lugar donde se aplic la vacuna (alrededor de 1de cada 4personas)  Fiebre leve (poco frecuente)  Dolor de Pensions consultant (alrededor de 1de cada 4personas)  Cansancio (alrededor de 1de cada 4personas) Problemas moderados despus de la vacuna Td: (Interfirieron en otras actividades, pero no requirieron atencin mdica)  Fiebre superior a 102F (38,8C) (poco frecuente) Problemas graves despus de la vacuna Td: (Impidieron Optometrist las actividades habituales; requirieron atencin mdica)  Clinical cytogeneticist, dolor intenso, sangrado o enrojecimiento en el brazo en que se aplic la vacuna (poco frecuente). Problemas que podran ocurrir despus de cualquier vacuna:   Las personas a veces se desmayan despus de un procedimiento mdico, incluida la vacunacin. Si permanece sentado o recostado durante  15 minutos puede ayudar a [employer] y las lesiones causadas por las cadas. Informe al mdico si se siente mareado, tiene cambios en la visin o zumbidos en los odos.  Algunas personas sienten un dolor intenso en el hombro y tienen dificultad para mover el brazo donde se coloc la vacuna. Esto sucede con muy poca frecuencia.  Cualquier medicamento puede causar una reaccin alrgica grave. Dichas reacciones son Orlene Erm poco frecuentes con una vacuna (se calcula que menos de 1en un milln de dosis) y se producen de unos minutos a unas horas despus de Writer. Al igual que con  cualquier [employer], existe una probabilidad muy remota de que una vacuna cause una lesin grave o la [place]. Se controla permanentemente la seguridad de las vacunas. Para obtener ms informacin, visite: http://www.aguilar.org/. 5. Qu pasa si hay una reaccin grave? A qu signos debo estar atento?   Observe todo lo que le preocupe, como signos de una reaccin alrgica grave, fiebre muy alta o comportamiento fuera de lo normal. Los signos de una reaccin alrgica grave pueden incluir ronchas, hinchazn de la cara y la garganta, dificultad para respirar, latidos cardacos acelerados, mareos y debilidad. Generalmente, estos comenzaran entre unos pocos minutos y algunas horas despus de la vacunacin. Qu debo hacer?   Si usted piensa que se trata de una reaccin alrgica grave o de otra emergencia que no puede esperar, llame al 911 o dirjase al hospital ms cercano. Sino, llame a su mdico.  Despus, la reaccin debe informarse al Sistema de Informacin sobre Efectos Adversos de las [place] (Vaccine Adverse Event Reporting System, VAERS). Su mdico puede presentar este informe, o puede hacerlo usted mismo a travs del sitio web de VAERS, en www.vaers.SamedayNews.es, o llamando al 5638731752. VAERS no brinda recomendaciones mdicas.  6. [place] Compensacin de Daos por [place] de Compensacin de Daos por Clinical biochemist (National Vaccine Injury Compensation Program, VICP) es un programa federal que fue creado para Patent examiner a las personas que puedan haber sufrido daos al recibir ciertas vacunas. Aquellas personas que consideren que han sufrido un dao como consecuencia de una vacuna y [place] saber ms acerca del programa y de cmo presentar Raechel Chute, pueden llamar al [phone] o visitar su sitio web en GoldCloset.com.ee. Hay un lmite de tiempo para presentar un reclamo de compensacin. 7. Cmo puedo obtener ms informacin?  Consulte a su  mdico. Este puede darle el prospecto de la vacuna o recomendarle otras fuentes de informacin.  Comunquese con el servicio de salud de su localidad o su estado.  Comunquese con los Centros para Building surveyor y la Prevencin de Probation officer for Disease Control and Prevention , CDC).  Llame al [phone] (1-800-CDC-INFO).  Visite el sitio Biomedical engineer en http://hunter.com/. Declaracin de informacin sobre la vacuna contra la difteria y el ttanos (Td) de los CDC (01/17/16) Esta informacin no tiene Marine scientist el consejo del mdico. Asegrese de hacerle al mdico cualquier pregunta que tenga. Document Released: 01/10/2009 Document Revised: 10/15/2014 Document Reviewed: 01/17/2016 Elsevier Interactive Patient Education  2017 [employer].     IF you received an x-ray today, you will receive an invoice from [facility] Rad[facility]. Please contact [facility] Radiology at [phone] with questions or concerns regarding your invoice.   IF you received labwork today, you will receive an invoice from [facility]. Please contact [facility] at [phone] with questions or concerns regarding your invoice.   Our billing staff will not be able to assist you with questions  regarding bills from these companies.  You will be contacted with the lab results as soon as they are available. The fastest way to get your results is to activate your My Chart account. Instructions are located on the last page of this paperwork. If you have not heard from [place] regarding the results in 2 weeks, please contact this office.

## 2016-11-26 NOTE — Progress Notes (Signed)
MRN: [id number]  Subjective:   [name] is a 56 y.o. male presenting for annual physical exam. Christopher Lam has a significant other, has 3 children. Has good relationships at home, has a good support network. He works as a Scientist, physiological. Binge drinks on the weekends, has 24 beers on Saturday-Sunday. Denies smoking cigarettes.   Medical care team includes: PCP: No PCP Per Patient Vision: Denies having had an eye exam. Plans on setting this up on his own. Dental: Gets dental cleanings every 6 months. Specialists: None.   Christopher Lam has a current medication list which includes the following prescription(s): multivitamin with minerals. He has No Known Allergies. Christopher Lam denies past medical history and past surgical history. Denies family history of cancer, diabetes, HTN, HL, heart disease, stroke, mental illness.   Immunizations: Flu shot on 07/08/2016. Needs tdap today.  Review of Systems  Constitutional: Negative for chills, diaphoresis, fever, malaise/fatigue and weight loss.  HENT: Negative for congestion, ear discharge, ear pain, hearing loss, nosebleeds, sore throat and tinnitus.   Eyes: Negative for blurred vision, double vision, photophobia, pain, discharge and redness.  Respiratory: Negative for cough, shortness of breath and wheezing.   Cardiovascular: Negative for chest pain, palpitations and leg swelling.  Gastrointestinal: Negative for abdominal pain, blood in stool, constipation, diarrhea, nausea and vomiting.  Genitourinary: Negative for dysuria, flank pain, frequency, hematuria and urgency.  Musculoskeletal: Negative for back pain, joint pain and myalgias.  Skin: Negative for itching and rash.  Neurological: Negative for dizziness, tingling, seizures, loss of consciousness, weakness and headaches.  Endo/Heme/Allergies: Negative for polydipsia.  Psychiatric/Behavioral: Negative for depression, hallucinations, memory loss, substance abuse and suicidal ideas. The patient is not  nervous/anxious and does not have insomnia.    Objective:   Vitals: BP 122/68 (BP Location: Right Arm, Patient Position: Sitting, Cuff Size: Small)   Pulse 60   Temp 98.1 F (36.7 C) (Oral)   Resp 16   Ht 5\' 6"  (1.676 m)   Wt 170 lb 6.4 oz (77.3 kg)   SpO2 97%   BMI 27.50 kg/m    Visual Acuity Screening   Right eye Left eye Both eyes  Without correction: 20/70 20/40 20/40   With correction:      Physical Exam  Constitutional: He is oriented to person, place, and time. He appears well-developed and well-nourished.  HENT:  TM's intact bilaterally, no effusions or erythema. Nasal turbinates pink and moist, nasal passages patent. No sinus tenderness. Oropharynx clear, mucous membranes moist, dentition in good repair.  Eyes: Conjunctivae and EOM are normal. Pupils are equal, round, and reactive to light. Right eye exhibits no discharge. Left eye exhibits no discharge. No scleral icterus.  Neck: Normal range of motion. Neck supple. No thyromegaly present.  Cardiovascular: Normal rate, regular rhythm and intact distal pulses.  Exam reveals no gallop and no friction rub.   No murmur heard. Pulmonary/Chest: No stridor. No respiratory distress. He has no wheezes. He has no rales.  Abdominal: Soft. Bowel sounds are normal. He exhibits no distension and no mass. There is no tenderness.  Musculoskeletal: Normal range of motion. He exhibits no edema or tenderness.  Lymphadenopathy:    He has no cervical adenopathy.  Neurological: He is alert and oriented to person, place, and time. He has normal reflexes.  Skin: Skin is warm and dry. No rash noted. No erythema. No pallor.  Psychiatric: He has a normal mood and affect.   Assessment and Plan :   1. Annual physical exam - Labs  pending, patient is medically stable. - Discussed healthy lifestyle, diet, exercise, preventative care, vaccinations, and addressed patient's concerns.   2. Screen for colon cancer - Ambulatory referral to  Gastroenterology  3. Blurred vision - Patient will schedule his appointment per his request.  4. Need for Tdap vaccination - Tdap vaccine greater than or equal to 7yo IM  5. Alcohol consumption binge drinking - Discussed alcohol abuse on the weekends. Recommended patient drink 1-2 beers daily instead of the 24 pack over 2 days.  - Counseled on risks of heavy alcohol use/binge drinking. He agreed to cut back.  Jaynee Eagles, PA-C [facility] [phone] 11/26/2016  3:45 PM

## 2016-11-27 LAB — COMPREHENSIVE METABOLIC PANEL
ALT: 35 IU/L (ref 0–44)
AST: 19 IU/L (ref 0–40)
Albumin/Globulin Ratio: 1.7 (ref 1.2–2.2)
Albumin: 4.3 g/dL (ref 3.5–5.5)
Alkaline Phosphatase: 114 IU/L (ref 39–117)
BUN/Creatinine Ratio: 15 (ref 9–20)
BUN: 12 mg/dL (ref 6–24)
Bilirubin Total: 0.5 mg/dL (ref 0.0–1.2)
CO2: 22 mmol/L (ref 18–29)
Calcium: 9.4 mg/dL (ref 8.7–10.2)
Chloride: 102 mmol/L (ref 96–106)
Creatinine, Ser: 0.79 mg/dL (ref 0.76–1.27)
GFR calc Af Amer: 118 mL/min/{1.73_m2} (ref >59)
GFR calc non Af Amer: 102 mL/min/{1.73_m2} (ref >59)
Globulin, Total: 2.5 g/dL (ref 1.5–4.5)
Glucose: 107 mg/dL — ABNORMAL HIGH (ref 65–99)
Potassium: 4.1 mmol/L (ref 3.5–5.2)
Sodium: 142 mmol/L (ref 134–144)
Total Protein: 6.8 g/dL (ref 6.0–8.5)

## 2016-11-27 LAB — CBC
Hematocrit: 46.5 % (ref 37.5–51.0)
Hemoglobin: 16.1 g/dL (ref 13.0–17.7)
MCH: 32.6 pg (ref 26.6–33.0)
MCHC: 34.6 g/dL (ref 31.5–35.7)
MCV: 94 fL (ref 79–97)
Platelets: 256 10*3/uL (ref 150–379)
RBC: 4.94 x10E6/uL (ref 4.14–5.80)
RDW: 12.3 % (ref 12.3–15.4)
WBC: 6.6 10*3/uL (ref 3.4–10.8)

## 2016-11-27 LAB — HEMOGLOBIN A1C
Est. average glucose Bld gHb Est-mCnc: 114 mg/dL
Hgb A1c MFr Bld: 5.6 % (ref 4.8–5.6)

## 2016-11-27 LAB — LIPID PANEL
Chol/HDL Ratio: 3.4 ratio units (ref 0.0–5.0)
Cholesterol, Total: 135 mg/dL (ref 100–199)
HDL: 40 mg/dL (ref >39)
LDL Calculated: 62 mg/dL (ref 0–99)
Triglycerides: 165 mg/dL — ABNORMAL HIGH (ref 0–149)
VLDL Cholesterol Cal: 33 mg/dL (ref 5–40)

## 2016-11-27 LAB — HIV ANTIBODY (ROUTINE TESTING W REFLEX): HIV Screen 4th Generation wRfx: NONREACTIVE

## 2016-11-27 LAB — HEPATITIS C ANTIBODY: Hep C Virus Ab: <0.1 s/co ratio (ref 0.0–0.9)

## 2016-11-27 LAB — TSH: TSH: 3.14 u[IU]/mL (ref 0.450–4.500)

## 2016-12-03 ENCOUNTER — Encounter: Payer: Self-pay | Admitting: Gastroenterology

## 2016-12-10 ENCOUNTER — Ambulatory Visit (AMBULATORY_SURGERY_CENTER): Payer: Self-pay

## 2016-12-10 VITALS — Ht 66.0 in | Wt 171.6 lb

## 2016-12-10 DIAGNOSIS — Z1211 Encounter for screening for malignant neoplasm of colon: Secondary | ICD-10-CM

## 2016-12-10 MED ORDER — NA SULFATE-K SULFATE-MG SULF 17.5-3.13-1.6 GM/177ML PO SOLN: 1.0000 | Freq: Once | ORAL | 0 refills | Status: AC

## 2016-12-10 NOTE — Progress Notes (Signed)
Denies allergies to eggs or soy products. Denies complication of anesthesia or sedation. Denies use of weight loss medication. Denies use of O2.   Emmi instructions declined. No home computer. 

## 2016-12-19 ENCOUNTER — Encounter: Payer: Self-pay | Admitting: Gastroenterology

## 2016-12-31 ENCOUNTER — Encounter: Payer: Self-pay | Admitting: Gastroenterology

## 2016-12-31 ENCOUNTER — Ambulatory Visit (AMBULATORY_SURGERY_CENTER): Payer: BLUE CROSS/BLUE SHIELD | Admitting: Gastroenterology

## 2016-12-31 VITALS — BP 111/70 | HR 62 | Temp 98.6°F | Resp 16 | Ht 66.0 in | Wt 171.0 lb

## 2016-12-31 DIAGNOSIS — D126 Benign neoplasm of colon, unspecified: Secondary | ICD-10-CM | POA: Diagnosis not present

## 2016-12-31 DIAGNOSIS — K635 Polyp of colon: Secondary | ICD-10-CM

## 2016-12-31 DIAGNOSIS — Z1211 Encounter for screening for malignant neoplasm of colon: Secondary | ICD-10-CM | POA: Diagnosis not present

## 2016-12-31 DIAGNOSIS — D123 Benign neoplasm of transverse colon: Secondary | ICD-10-CM

## 2016-12-31 DIAGNOSIS — D122 Benign neoplasm of ascending colon: Secondary | ICD-10-CM | POA: Diagnosis not present

## 2016-12-31 DIAGNOSIS — Z1212 Encounter for screening for malignant neoplasm of rectum: Secondary | ICD-10-CM | POA: Diagnosis not present

## 2016-12-31 MED ORDER — SODIUM CHLORIDE 0.9 % IV SOLN: 500.0000 mL | INTRAVENOUS | Status: AC

## 2016-12-31 NOTE — Progress Notes (Signed)
Report given to PACU, vss 

## 2016-12-31 NOTE — Patient Instructions (Signed)
YOU HAD AN ENDOSCOPIC PROCEDURE TODAY AT THE [facility]:   Refer to the procedure report that was given to you for any specific questions about what was found during the examination.  If the procedure report does not answer your questions, please call your gastroenterologist to clarify.  If you requested that your care partner not be given the details of your procedure findings, then the procedure report has been included in a sealed envelope for you to review at your convenience later.  YOU SHOULD EXPECT: Some feelings of bloating in the abdomen. Passage of more gas than usual.  Walking can help get rid of the air that was put into your GI tract during the procedure and reduce the bloating. If you had a lower endoscopy (such as a colonoscopy or flexible sigmoidoscopy) you may notice spotting of blood in your stool or on the toilet paper. If you underwent a bowel prep for your procedure, you may not have a normal bowel movement for a few days.  Please Note:  You might notice some irritation and congestion in your nose or some drainage.  This is from the oxygen used during your procedure.  There is no need for concern and it should clear up in a day or so.  SYMPTOMS TO REPORT IMMEDIATELY:   Following lower endoscopy (colonoscopy or flexible sigmoidoscopy):  Excessive amounts of blood in the stool  Significant tenderness or worsening of abdominal pains  Swelling of the abdomen that is new, acute  Fever of 100F or higher  For urgent or emergent issues, a gastroenterologist can be reached at any hour by calling [phone].   DIET:  We do recommend a small meal at first, but then you may proceed to your regular diet.  Drink plenty of fluids but you should avoid alcoholic beverages for 24 hours.  ACTIVITY:  You should plan to take it easy for the rest of today and you should NOT DRIVE or use heavy machinery until tomorrow (because of the sedation medicines used during the test).     FOLLOW UP: Our staff will call the number listed on your records the next business day following your procedure to check on you and address any questions or concerns that you may have regarding the information given to you following your procedure. If we do not reach you, we will leave a message.  However, if you are feeling well and you are not experiencing any problems, there is no need to return our call.  We will assume that you have returned to your regular daily activities without incident.  If any biopsies were taken you will be contacted by phone or by letter within the next 1-3 weeks.  Please call us at [phone] if you have not heard about the biopsies in 3 weeks.    SIGNATURES/CONFIDENTIALITY: You and/or your care partner have signed paperwork which will be entered into your electronic medical record.  These signatures attest to the fact that that the information above on your After Visit Summary has been reviewed and is understood.  Full responsibility of the confidentiality of this discharge information lies with you and/or your care-partner.  Polyp,diverticulosis, and high fiber diet information given.  

## 2016-12-31 NOTE — Progress Notes (Signed)
Pt's states no medical or surgical changes since previsit or office visit. 

## 2016-12-31 NOTE — Op Note (Signed)
[place] Patient Name: Christopher Lam Procedure Date: 12/31/2016 2:53 PM MRN: [id number] Endoscopist: Mallie Mussel L. Loletha Carrow , MD Age: 56 Referring MD:  Date of Birth: 18-Jan-1961 Gender: Male Account #: [id number] Procedure:                Colonoscopy Indications:              Screening for colorectal malignant neoplasm, This                            is the patient's first colonoscopy Medicines:                Monitored Anesthesia Care Procedure:                Pre-Anesthesia Assessment:                           - Prior to the procedure, a History and Physical                            was performed, and patient medications and                            allergies were reviewed. The patient's tolerance of                            previous anesthesia was also reviewed. The risks                            and benefits of the procedure and the sedation                            options and risks were discussed with the patient.                            All questions were answered, and informed consent                            was obtained. Prior Anticoagulants: The patient has                            taken no previous anticoagulant or antiplatelet                            agents. ASA Grade Assessment: I - A normal, healthy                            patient. After reviewing the risks and benefits,                            the patient was deemed in satisfactory condition to                            undergo the procedure.  After obtaining informed consent, the colonoscope                            was passed under direct vision. Throughout the                            procedure, the patient's blood pressure, pulse, and                            oxygen saturations were monitored continuously. The                            Model CF-HQ190L [id number]) scope was introduced                            through the anus and advanced to the  the cecum,                            identified by appendiceal orifice and ileocecal                            valve. The colonoscopy was performed without                            difficulty. The patient tolerated the procedure                            well. The quality of the bowel preparation was                            excellent. The ileocecal valve, appendiceal                            orifice, and rectum were photographed. The quality                            of the bowel preparation was evaluated using the                            BBPS [facility] Bowel Preparation Scale) with scores                            of: Right Colon = 3, Transverse Colon = 3 and Left                            Colon = 3 (entire mucosa seen well with no residual                            staining, small fragments of stool or opaque                            liquid). The total BBPS score equals 9. The bowel  preparation used was SUPREP. Scope In: 3:04:58 PM Scope Out: 3:17:05 PM Scope Withdrawal Time: 0 hours 10 minutes 30 seconds  Total Procedure Duration: 0 hours 12 minutes 7 seconds  Findings:                 The perianal and digital rectal examinations were                            normal.                           Two sessile polyps were found in the mid transverse                            colon and mid ascending colon. The polyps were 4 mm                            in size. These polyps were removed with a cold                            snare. Resection and retrieval were complete.                           Multiple small-mouthed diverticula were found in                            the left colon and right colon.                           The exam was otherwise without abnormality on                            direct and retroflexion views. Complications:            No immediate complications. Estimated Blood Loss:     Estimated blood loss:  none. Impression:               - Two 4 mm polyps in the mid transverse colon and                            in the mid ascending colon, removed with a cold                            snare. Resected and retrieved.                           - Diverticulosis in the left colon and in the right                            colon.                           - The examination was otherwise normal on direct                            and  retroflexion views. Recommendation:           - Patient has a contact number available for                            emergencies. The signs and symptoms of potential                            delayed complications were discussed with the                            patient. Return to normal activities tomorrow.                            Written discharge instructions were provided to the                            patient.                           - Resume previous diet.                           - Continue present medications.                           - Await pathology results.                           - Repeat colonoscopy is recommended for                            surveillance. The colonoscopy date will be                            determined after pathology results from today's                            exam become available for review. Mylo Driskill L. Loletha Carrow, MD 12/31/2016 3:25:10 PM This report has been signed electronically.

## 2016-12-31 NOTE — Progress Notes (Signed)
Called to room to assist during endoscopic procedure.  Patient ID and intended procedure confirmed with present staff. Received instructions for my participation in the procedure from the performing physician.  

## 2017-01-01 ENCOUNTER — Telehealth: Payer: Self-pay

## 2017-01-01 NOTE — Telephone Encounter (Signed)
  Follow up Call-  Call back number 12/31/2016  Post procedure Call Back phone  # [phone] with wife  Permission to leave phone message Yes  Some recent data might be hidden    Patient was called for follow up after his procedure on 12/31/16. No answer at the number given for follow up phone call. A message was left on voice mail.

## 2017-01-01 NOTE — Telephone Encounter (Signed)
Number identifier, left voicemail will call back later today.

## 2017-01-03 ENCOUNTER — Encounter: Payer: Self-pay | Admitting: Gastroenterology

## 2017-07-23 DIAGNOSIS — Z23 Encounter for immunization: Secondary | ICD-10-CM | POA: Diagnosis not present

## 2018-07-29 DIAGNOSIS — Z23 Encounter for immunization: Secondary | ICD-10-CM | POA: Diagnosis not present

## 2021-12-25 ENCOUNTER — Encounter: Payer: Self-pay | Admitting: Gastroenterology

## 2023-12-05 ENCOUNTER — Encounter: Payer: Self-pay | Admitting: Gastroenterology

## 2024-01-25 ENCOUNTER — Encounter (HOSPITAL_COMMUNITY): Payer: Self-pay | Admitting: Emergency Medicine

## 2024-01-25 ENCOUNTER — Emergency Department (HOSPITAL_COMMUNITY)

## 2024-01-25 ENCOUNTER — Emergency Department (HOSPITAL_COMMUNITY)
Admission: EM | Admit: 2024-01-25 | Discharge: 2024-01-26 | Disposition: A | Discharge status: Home / Self Care | Attending: Emergency Medicine | Admitting: Emergency Medicine

## 2024-01-25 DIAGNOSIS — S0990XA Unspecified injury of head, initial encounter: Secondary | ICD-10-CM | POA: Diagnosis not present

## 2024-01-25 DIAGNOSIS — F10920 Alcohol use, unspecified with intoxication, uncomplicated: Secondary | ICD-10-CM | POA: Insufficient documentation

## 2024-01-25 DIAGNOSIS — W19XXXA Unspecified fall, initial encounter: Secondary | ICD-10-CM | POA: Insufficient documentation

## 2024-01-25 DIAGNOSIS — S43015A Anterior dislocation of left humerus, initial encounter: Secondary | ICD-10-CM | POA: Insufficient documentation

## 2024-01-25 DIAGNOSIS — M7989 Other specified soft tissue disorders: Secondary | ICD-10-CM | POA: Insufficient documentation

## 2024-01-25 DIAGNOSIS — R799 Abnormal finding of blood chemistry, unspecified: Secondary | ICD-10-CM | POA: Diagnosis not present

## 2024-01-25 DIAGNOSIS — S43005A Unspecified dislocation of left shoulder joint, initial encounter: Secondary | ICD-10-CM

## 2024-01-25 DIAGNOSIS — S4992XA Unspecified injury of left shoulder and upper arm, initial encounter: Secondary | ICD-10-CM | POA: Diagnosis present

## 2024-01-25 LAB — PROTIME-INR
INR: 1 (ref 0.8–1.2)
Prothrombin Time: 13.1 s (ref 11.4–15.2)

## 2024-01-25 LAB — CBC WITH DIFFERENTIAL/PLATELET
Abs Immature Granulocytes: 0.01 10*3/uL (ref 0.00–0.07)
Basophils Absolute: 0 10*3/uL (ref 0.0–0.1)
Basophils Relative: 1 %
Eosinophils Absolute: 0.1 10*3/uL (ref 0.0–0.5)
Eosinophils Relative: 2 %
HCT: 43.3 % (ref 39.0–52.0)
Hemoglobin: 14.5 g/dL (ref 13.0–17.0)
Immature Granulocytes: 0 %
Lymphocytes Relative: 39 %
Lymphs Abs: 3 10*3/uL (ref 0.7–4.0)
MCH: 31.5 pg (ref 26.0–34.0)
MCHC: 33.5 g/dL (ref 30.0–36.0)
MCV: 94.1 fL (ref 80.0–100.0)
Monocytes Absolute: 1.3 10*3/uL — ABNORMAL HIGH (ref 0.1–1.0)
Monocytes Relative: 16 %
Neutro Abs: 3.3 10*3/uL (ref 1.7–7.7)
Neutrophils Relative %: 42 %
Platelets: 231 10*3/uL (ref 150–400)
RBC: 4.6 MIL/uL (ref 4.22–5.81)
RDW: 12.4 % (ref 11.5–15.5)
WBC: 7.8 10*3/uL (ref 4.0–10.5)
nRBC: 0 % (ref 0.0–0.2)

## 2024-01-25 MED ORDER — LACTATED RINGERS IV BOLUS
1000.0000 mL | Freq: Once | INTRAVENOUS | Status: AC
  Administered 2024-01-25: 1000 mL via INTRAVENOUS

## 2024-01-25 MED ORDER — LIDOCAINE HCL 2 % IJ SOLN
20.0000 mL | Freq: Once | INTRAMUSCULAR | Status: AC
  Administered 2024-01-26: 400 mg
  Filled 2024-01-25: qty 20
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
## 2024-01-26 ENCOUNTER — Emergency Department (HOSPITAL_COMMUNITY)

## 2024-01-26 LAB — COMPREHENSIVE METABOLIC PANEL WITH GFR
ALT: 48 U/L — ABNORMAL HIGH (ref 0–44)
AST: 31 U/L (ref 15–41)
Albumin: 4 g/dL (ref 3.5–5.0)
Alkaline Phosphatase: 103 U/L (ref 38–126)
Anion gap: 13 (ref 5–15)
BUN: 17 mg/dL (ref 8–23)
CO2: 21 mmol/L — ABNORMAL LOW (ref 22–32)
Calcium: 9.2 mg/dL (ref 8.9–10.3)
Chloride: 111 mmol/L (ref 98–111)
Creatinine, Ser: 1.12 mg/dL (ref 0.61–1.24)
GFR, Estimated: >60 mL/min (ref >60)
Glucose, Bld: 142 mg/dL — ABNORMAL HIGH (ref 70–99)
Potassium: 3.2 mmol/L — ABNORMAL LOW (ref 3.5–5.1)
Sodium: 145 mmol/L (ref 135–145)
Total Bilirubin: 1 mg/dL (ref 0.0–1.2)
Total Protein: 6.8 g/dL (ref 6.5–8.1)

## 2024-01-26 MED ORDER — FENTANYL CITRATE PF 50 MCG/ML IJ SOSY
100.0000 ug | PREFILLED_SYRINGE | Freq: Once | INTRAMUSCULAR | Status: AC
  Administered 2024-01-26: 100 ug via INTRAVENOUS
  Filled 2024-01-26: qty 2

## 2024-01-26 MED ORDER — LACTATED RINGERS IV BOLUS
1000.0000 mL | Freq: Once | INTRAVENOUS | Status: AC
  Administered 2024-01-26: 1000 mL via INTRAVENOUS

## 2024-01-26 MED ORDER — OXYCODONE-ACETAMINOPHEN 5-325 MG PO TABS: 1.0000 | ORAL_TABLET | Freq: Three times a day (TID) | ORAL | 0 refills | Status: AC | PRN

## 2024-01-26 NOTE — Progress Notes (Signed)
 Orthopedic Tech Progress Note Patient Details:  Christopher Lam 1961/01/23 [id number]  Ortho Devices Type of Ortho Device: Arm sling Ortho Device/Splint Location: lue Ortho Device/Splint Interventions: Ordered, Application, Adjustment   Post Interventions Patient Tolerated: Well Instructions Provided: Care of device, Adjustment of device  [name] 01/26/2024, 2:21 AM
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
